# Patient Record
Sex: Female | Born: 1935 | Race: Black or African American | Hispanic: No | Smoking: Never smoker
Health system: Southern US, Community
[De-identification: ages and names within clinical notes are randomized; demographics above are authoritative.]

## PROBLEM LIST (undated history)

## (undated) DIAGNOSIS — E119 Type 2 diabetes mellitus without complications: Secondary | ICD-10-CM

---

## 2007-01-31 ENCOUNTER — Other Ambulatory Visit: Payer: Self-pay

## 2007-01-31 ENCOUNTER — Inpatient Hospital Stay: Payer: Self-pay | Admitting: Internal Medicine

## 2008-03-12 ENCOUNTER — Other Ambulatory Visit: Payer: Self-pay

## 2008-03-12 ENCOUNTER — Emergency Department: Payer: Self-pay | Admitting: Internal Medicine

## 2008-09-21 ENCOUNTER — Ambulatory Visit: Payer: Self-pay | Admitting: Family Medicine

## 2010-05-27 ENCOUNTER — Emergency Department: Payer: Self-pay | Admitting: Emergency Medicine

## 2010-12-03 ENCOUNTER — Observation Stay: Payer: Self-pay | Admitting: Internal Medicine

## 2011-01-04 ENCOUNTER — Emergency Department: Payer: Self-pay | Admitting: Emergency Medicine

## 2011-03-19 ENCOUNTER — Emergency Department: Payer: Self-pay | Admitting: Internal Medicine

## 2011-04-13 ENCOUNTER — Emergency Department: Payer: Self-pay | Admitting: Internal Medicine

## 2011-08-05 ENCOUNTER — Inpatient Hospital Stay: Payer: Self-pay | Admitting: Internal Medicine

## 2011-08-05 LAB — URINALYSIS, COMPLETE
Bilirubin,UR: NEGATIVE
Ketone: NEGATIVE
Leukocyte Esterase: NEGATIVE
Ph: 8 (ref 4.5–8.0)
Specific Gravity: 1.01 (ref 1.003–1.030)

## 2011-08-05 LAB — CK TOTAL AND CKMB (NOT AT ARMC)
CK, Total: 169 U/L (ref 21–215)
CK-MB: 1.2 ng/mL (ref 0.5–3.6)

## 2011-08-05 LAB — COMPREHENSIVE METABOLIC PANEL
Albumin: 3.3 g/dL — ABNORMAL LOW (ref 3.4–5.0)
Alkaline Phosphatase: 51 U/L (ref 50–136)
Bilirubin,Total: 0.3 mg/dL (ref 0.2–1.0)
Calcium, Total: 9.3 mg/dL (ref 8.5–10.1)
Chloride: 107 mmol/L (ref 98–107)
Creatinine: 1.3 mg/dL (ref 0.60–1.30)
EGFR (Non-African Amer.): 41 — ABNORMAL LOW
Glucose: 179 mg/dL — ABNORMAL HIGH (ref 65–99)
Osmolality: 299 (ref 275–301)
SGPT (ALT): 20 U/L
Sodium: 145 mmol/L (ref 136–145)
Total Protein: 7.7 g/dL (ref 6.4–8.2)

## 2011-08-05 LAB — CBC
HCT: 36.9 % (ref 35.0–47.0)
MCH: 27.6 pg (ref 26.0–34.0)
MCV: 86 fL (ref 80–100)
RBC: 4.31 10*6/uL (ref 3.80–5.20)
WBC: 5.6 10*3/uL (ref 3.6–11.0)

## 2011-08-05 LAB — TROPONIN I: Troponin-I: <0.02 ng/mL

## 2011-08-06 LAB — CBC WITH DIFFERENTIAL/PLATELET
Basophil #: 0 10*3/uL (ref 0.0–0.1)
Eosinophil #: 0.1 10*3/uL (ref 0.0–0.7)
HCT: 33.5 % — ABNORMAL LOW (ref 35.0–47.0)
Lymphocyte #: 1 10*3/uL (ref 1.0–3.6)
MCH: 27.5 pg (ref 26.0–34.0)
MCHC: 32.4 g/dL (ref 32.0–36.0)
MCV: 85 fL (ref 80–100)
Monocyte #: 0.5 10*3/uL (ref 0.0–0.7)
Monocyte %: 9.4 %
Neutrophil #: 3.6 10*3/uL (ref 1.4–6.5)
Neutrophil %: 69.3 %
Platelet: 189 10*3/uL (ref 150–440)
RBC: 3.95 10*6/uL (ref 3.80–5.20)
RDW: 16.5 % — ABNORMAL HIGH (ref 11.5–14.5)

## 2011-08-06 LAB — BASIC METABOLIC PANEL
Anion Gap: 11 (ref 7–16)
BUN: 20 mg/dL — ABNORMAL HIGH (ref 7–18)
Chloride: 106 mmol/L (ref 98–107)
EGFR (Non-African Amer.): 58 — ABNORMAL LOW
Glucose: 104 mg/dL — ABNORMAL HIGH (ref 65–99)
Osmolality: 295 (ref 275–301)

## 2011-09-11 ENCOUNTER — Ambulatory Visit: Payer: Self-pay | Admitting: Family Medicine

## 2011-09-13 ENCOUNTER — Ambulatory Visit: Payer: Self-pay | Admitting: Family Medicine

## 2012-06-11 ENCOUNTER — Ambulatory Visit: Payer: Self-pay | Admitting: Internal Medicine

## 2012-06-13 ENCOUNTER — Emergency Department: Payer: Self-pay | Admitting: Emergency Medicine

## 2012-06-13 LAB — URINALYSIS, COMPLETE
Bilirubin,UR: NEGATIVE
Leukocyte Esterase: NEGATIVE
Nitrite: NEGATIVE
Ph: 6 (ref 4.5–8.0)
Protein: NEGATIVE
RBC,UR: 2 /HPF (ref 0–5)
Specific Gravity: 1.019 (ref 1.003–1.030)
Squamous Epithelial: 6
WBC UR: 1 /HPF (ref 0–5)

## 2012-06-13 LAB — BASIC METABOLIC PANEL
Anion Gap: 6 — ABNORMAL LOW (ref 7–16)
BUN: 28 mg/dL — ABNORMAL HIGH (ref 7–18)
Calcium, Total: 9.4 mg/dL (ref 8.5–10.1)
Chloride: 106 mmol/L (ref 98–107)
Co2: 32 mmol/L (ref 21–32)
EGFR (African American): 54 — ABNORMAL LOW
EGFR (Non-African Amer.): 46 — ABNORMAL LOW
Osmolality: 295 (ref 275–301)

## 2012-06-13 LAB — CBC
HCT: 41.6 % (ref 35.0–47.0)
MCHC: 32 g/dL (ref 32.0–36.0)
MCV: 86 fL (ref 80–100)
Platelet: 200 10*3/uL (ref 150–440)
WBC: 5.1 10*3/uL (ref 3.6–11.0)

## 2012-08-02 ENCOUNTER — Emergency Department: Payer: Self-pay | Admitting: Emergency Medicine

## 2012-08-02 LAB — URINALYSIS, COMPLETE
Bacteria: NONE SEEN
Bilirubin,UR: NEGATIVE
Blood: NEGATIVE
Ketone: NEGATIVE
Leukocyte Esterase: NEGATIVE
Ph: 8 (ref 4.5–8.0)
Specific Gravity: 1.013 (ref 1.003–1.030)
Squamous Epithelial: <1
WBC UR: 1 /HPF (ref 0–5)

## 2012-08-02 LAB — COMPREHENSIVE METABOLIC PANEL
Albumin: 3.2 g/dL — ABNORMAL LOW (ref 3.4–5.0)
Anion Gap: 4 — ABNORMAL LOW (ref 7–16)
BUN: 24 mg/dL — ABNORMAL HIGH (ref 7–18)
Bilirubin,Total: 0.4 mg/dL (ref 0.2–1.0)
Chloride: 105 mmol/L (ref 98–107)
Creatinine: 0.92 mg/dL (ref 0.60–1.30)
EGFR (African American): >60
Glucose: 124 mg/dL — ABNORMAL HIGH (ref 65–99)
Potassium: 3.4 mmol/L — ABNORMAL LOW (ref 3.5–5.1)
SGPT (ALT): 15 U/L (ref 12–78)
Sodium: 140 mmol/L (ref 136–145)
Total Protein: 7.8 g/dL (ref 6.4–8.2)

## 2012-08-02 LAB — CBC
HCT: 40.3 % (ref 35.0–47.0)
HGB: 13.2 g/dL (ref 12.0–16.0)
MCHC: 32.6 g/dL (ref 32.0–36.0)
Platelet: 198 10*3/uL (ref 150–440)
RDW: 15.5 % — ABNORMAL HIGH (ref 11.5–14.5)

## 2012-12-24 ENCOUNTER — Emergency Department: Payer: Self-pay

## 2012-12-24 LAB — CBC WITH DIFFERENTIAL/PLATELET
Eosinophil #: 0.1 10*3/uL (ref 0.0–0.7)
HCT: 37.2 % (ref 35.0–47.0)
Lymphocyte #: 1.3 10*3/uL (ref 1.0–3.6)
Lymphocyte %: 25.6 %
MCHC: 31.2 g/dL — ABNORMAL LOW (ref 32.0–36.0)
MCV: 85 fL (ref 80–100)
Monocyte #: 0.5 x10 3/mm (ref 0.2–0.9)
Neutrophil #: 3.3 10*3/uL (ref 1.4–6.5)
Neutrophil %: 62.9 %
RBC: 4.36 10*6/uL (ref 3.80–5.20)
WBC: 5.2 10*3/uL (ref 3.6–11.0)

## 2012-12-24 LAB — URINALYSIS, COMPLETE
Blood: NEGATIVE
Ketone: NEGATIVE
Leukocyte Esterase: NEGATIVE
Ph: 7 (ref 4.5–8.0)
Protein: NEGATIVE
RBC,UR: <1 /HPF (ref 0–5)
Specific Gravity: 1.016 (ref 1.003–1.030)
Squamous Epithelial: 2
WBC UR: 1 /HPF (ref 0–5)

## 2012-12-24 LAB — BASIC METABOLIC PANEL
Anion Gap: 6 — ABNORMAL LOW (ref 7–16)
Calcium, Total: 9.6 mg/dL (ref 8.5–10.1)
Chloride: 106 mmol/L (ref 98–107)
EGFR (African American): 53 — ABNORMAL LOW
EGFR (Non-African Amer.): 45 — ABNORMAL LOW
Glucose: 130 mg/dL — ABNORMAL HIGH (ref 65–99)

## 2013-03-18 ENCOUNTER — Emergency Department: Payer: Self-pay | Admitting: Emergency Medicine

## 2013-05-21 ENCOUNTER — Inpatient Hospital Stay: Payer: Self-pay | Admitting: Internal Medicine

## 2013-05-21 LAB — URINALYSIS, COMPLETE
Bilirubin,UR: NEGATIVE
Blood: NEGATIVE
Glucose,UR: 50 mg/dL (ref 0–75)
Ketone: NEGATIVE
Leukocyte Esterase: NEGATIVE
Nitrite: NEGATIVE
Protein: NEGATIVE
RBC,UR: 4 /HPF (ref 0–5)
Squamous Epithelial: 15

## 2013-05-21 LAB — TROPONIN I
Troponin-I: <0.02 ng/mL
Troponin-I: <0.02 ng/mL
Troponin-I: 0.02 ng/mL

## 2013-05-21 LAB — BASIC METABOLIC PANEL
Anion Gap: 8 (ref 7–16)
BUN: 21 mg/dL — ABNORMAL HIGH (ref 7–18)
Calcium, Total: 9.7 mg/dL (ref 8.5–10.1)
Chloride: 103 mmol/L (ref 98–107)
Co2: 29 mmol/L (ref 21–32)
EGFR (African American): >60
Glucose: 172 mg/dL — ABNORMAL HIGH (ref 65–99)
Osmolality: 286 (ref 275–301)
Potassium: 3.2 mmol/L — ABNORMAL LOW (ref 3.5–5.1)
Sodium: 140 mmol/L (ref 136–145)

## 2013-05-21 LAB — CBC WITH DIFFERENTIAL/PLATELET
Basophil #: 0 10*3/uL (ref 0.0–0.1)
Eosinophil #: 0.1 10*3/uL (ref 0.0–0.7)
Eosinophil %: 1.6 %
HCT: 40.3 % (ref 35.0–47.0)
Lymphocyte #: 0.9 10*3/uL — ABNORMAL LOW (ref 1.0–3.6)
MCH: 26.7 pg (ref 26.0–34.0)
MCHC: 31.5 g/dL — ABNORMAL LOW (ref 32.0–36.0)
MCV: 85 fL (ref 80–100)
Monocyte #: 0.4 x10 3/mm (ref 0.2–0.9)
Neutrophil %: 67.5 %
Platelet: 187 10*3/uL (ref 150–440)
RBC: 4.77 10*6/uL (ref 3.80–5.20)
RDW: 16 % — ABNORMAL HIGH (ref 11.5–14.5)
WBC: 4.4 10*3/uL (ref 3.6–11.0)

## 2013-05-21 LAB — CK TOTAL AND CKMB (NOT AT ARMC): CK-MB: 0.8 ng/mL (ref 0.5–3.6)

## 2013-05-22 LAB — CBC WITH DIFFERENTIAL/PLATELET
Basophil #: 0.1 10*3/uL (ref 0.0–0.1)
Basophil %: 2 %
Eosinophil %: 2.5 %
HGB: 12.9 g/dL (ref 12.0–16.0)
MCH: 27.3 pg (ref 26.0–34.0)
MCHC: 32.2 g/dL (ref 32.0–36.0)
Monocyte #: 0.4 x10 3/mm (ref 0.2–0.9)
Neutrophil #: 3 10*3/uL (ref 1.4–6.5)
Platelet: 192 10*3/uL (ref 150–440)

## 2013-05-22 LAB — BASIC METABOLIC PANEL
Anion Gap: 4 — ABNORMAL LOW (ref 7–16)
BUN: 15 mg/dL (ref 7–18)
Calcium, Total: 9.4 mg/dL (ref 8.5–10.1)
Chloride: 103 mmol/L (ref 98–107)
Creatinine: 0.88 mg/dL (ref 0.60–1.30)
EGFR (African American): >60
EGFR (Non-African Amer.): 58 — ABNORMAL LOW
Glucose: 124 mg/dL — ABNORMAL HIGH (ref 65–99)
Osmolality: 280 (ref 275–301)
Potassium: 3.1 mmol/L — ABNORMAL LOW (ref 3.5–5.1)
Sodium: 139 mmol/L (ref 136–145)

## 2013-05-22 LAB — MAGNESIUM: Magnesium: 1.8 mg/dL

## 2013-05-22 LAB — TSH: Thyroid Stimulating Horm: 4.52 u[IU]/mL — ABNORMAL HIGH

## 2013-05-22 LAB — LIPID PANEL
Cholesterol: 199 mg/dL (ref 0–200)
Ldl Cholesterol, Calc: 119 mg/dL — ABNORMAL HIGH (ref 0–100)
VLDL Cholesterol, Calc: 22 mg/dL (ref 5–40)

## 2013-05-23 LAB — MAGNESIUM: Magnesium: 1.6 mg/dL — ABNORMAL LOW

## 2013-05-23 LAB — POTASSIUM: Potassium: 3.3 mmol/L — ABNORMAL LOW (ref 3.5–5.1)

## 2013-06-03 ENCOUNTER — Other Ambulatory Visit: Payer: Self-pay | Admitting: Family Medicine

## 2013-06-04 ENCOUNTER — Other Ambulatory Visit: Payer: Self-pay | Admitting: Family Medicine

## 2013-06-04 LAB — COMPREHENSIVE METABOLIC PANEL
Albumin: 3.1 g/dL — ABNORMAL LOW (ref 3.4–5.0)
BUN: 33 mg/dL — ABNORMAL HIGH (ref 7–18)
Calcium, Total: 9.1 mg/dL (ref 8.5–10.1)
Chloride: 104 mmol/L (ref 98–107)
Creatinine: 1.03 mg/dL (ref 0.60–1.30)
EGFR (Non-African Amer.): 48 — ABNORMAL LOW
Glucose: 138 mg/dL — ABNORMAL HIGH (ref 65–99)
Osmolality: 283 (ref 275–301)
SGOT(AST): 39 U/L — ABNORMAL HIGH (ref 15–37)

## 2013-06-04 LAB — CBC WITH DIFFERENTIAL/PLATELET
Basophil #: 0 10*3/uL (ref 0.0–0.1)
Eosinophil #: 0 10*3/uL (ref 0.0–0.7)
HCT: 40 % (ref 35.0–47.0)
HGB: 12.8 g/dL (ref 12.0–16.0)
Lymphocyte #: 0.6 10*3/uL — ABNORMAL LOW (ref 1.0–3.6)
Lymphocyte %: 13.7 %
MCH: 27.1 pg (ref 26.0–34.0)
MCHC: 32 g/dL (ref 32.0–36.0)
MCV: 85 fL (ref 80–100)
Monocyte #: 0.5 x10 3/mm (ref 0.2–0.9)
Monocyte %: 11.2 %
Neutrophil #: 2.9 10*3/uL (ref 1.4–6.5)
Platelet: 181 10*3/uL (ref 150–440)
RBC: 4.72 10*6/uL (ref 3.80–5.20)
RDW: 15.8 % — ABNORMAL HIGH (ref 11.5–14.5)
WBC: 4 10*3/uL (ref 3.6–11.0)

## 2014-08-23 ENCOUNTER — Emergency Department: Payer: Self-pay | Admitting: Emergency Medicine

## 2014-09-30 NOTE — Discharge Summary (Signed)
PATIENT NAME:  Carrie SailorsRICHMOND, Carrie M MR#:  [id number] DATE OF BIRTH:  September 12, 1921  DATE OF ADMISSION:  05/21/2013 DATE OF DISCHARGE: 05/24/2013  PRESENTING COMPLAINT: Falls and weakness.   DISCHARGE DIAGNOSES: 1. Falls likely due to generalized deconditioning. The patient will be going to rehab for physical therapy.  2. Chronic paroxysmal atrial fibrillation. Hemodynamically stable, asymptomatic. Not a good candidate for anticoagulation due to falls. Continue aspirin.  3. Type 2 diabetes.  4. Hyperlipidemia.  5. Hypokalemia on oral supplements.  6. Hypertension.  7. Hypothyroidism, new onset. The patient started on oral Synthroid.   CONDITION ON DISCHARGE: Fair.   CODE STATUS: Full code.   MEDICATIONS: Magnesium oxide.   [name] is a pleasant 81 year old African American female who is brought in from home after she had a fall on 12/12. She has history of hypertension, diabetes and hyperlipidemia. She was admitted with:  1. Mechanical fall which is suspected due to her generalized deconditioning, less likely due to atrial fibrillation. The patient was seen by physical therapy. They recommend rehab for which  social worker is working on getting a place for her.  2. Paroxysmal atrial fibrillation hemodynamically stable. The patient asymptomatic. Rate controlled with low-dose metoprolol. The patient is tolerating it well. Given her frequent falls she is not a good candidate for anticoagulation, her aspirin was continued.  3. Type 2 diabetes. Metformin and pioglitazone is continued.  4. Hyperlipidemia. Continue simvastatin.  5. Hyperkalemia on oral supplements along with hypomagnesemia on p.o. mag. 6. Hypothyroidism, new onset start low dose p.o. Synthroid.  7. Hypertension. The patient currently is on felodipine, lisinopril, metoprolol and low-dose hydrochlorothiazide.   Overall hospital stay remained stable.   CODE STATUS: THE PATIENT REMAINED A FULL CODE.   The patient will be  discharged to rehab once bed available.   DISCHARGE VITAL SIGNS: Blood pressure is 129/76. Pulse rate was 65, sats are 97% on room air.   ____________________________ Jearl KlinefelterSona A. Allena KatzPatel, MD sap:sg D: 05/24/2013 09:26:00 ET T: 05/24/2013 10:00:54 ET JOB#: [id number]  cc: Neomia Dearavid N. Harrington Challengerhies, MD Kaleia Longhi A. Allena KatzPatel, MD, <Dictator>   Willow OraSONA A Raechel Marcos MD ELECTRONICALLY SIGNED 05/27/2013 10:57

## 2014-10-01 NOTE — H&P (Signed)
PATIENT NAME:  [name], Shanta M MR#:  [id number] DATE OF BIRTH:  [birth year]  DATE OF ADMISSION:  05/21/2013  PRIMARY CARE PHYSICIAN: Dr. Mickey Farberavid Thies.    REFERRING PHYSICIAN: Dr. Bayard Malesandolph Brown.  CHIEF COMPLAINT: Fell down.   HISTORY OF PRESENT ILLNESS: [name] is an 81 year old female with a past medical history of diabetes mellitus on oral medication, hypertension, hyperlipidemia, presented to the Emergency Department after having a fall at home. Per daughter, the patient has frequent falls. Last night, the patient was trying to stand up from a sitting position, felt dizzy and fell down to the floor. The patient felt too weak to stand up. Concerning this, EMS was called and was brought to the Emergency Department. At the time of presentation, the patient had atrial fibrillation with a heart rate of 105; however, at the time of my examination, the patient is already in a normal sinus rhythm. The patient states experiences severe by mouth. Denies having any chest pain, palpitations. No recent history of any diarrhea. Workup in the Emergency Department, low potassium of 3.2. The patient is on hydrochlorothiazide and other peripheral vasodilating anti-blood pressure medications like felodipine. Per daughter, the patient lives a sedentary lifestyle. Sits most of the day on the couch. Involved with physical therapy multiple times; however, the patient was not interested in continuing with the therapy.   PAST MEDICAL HISTORY:  1. Hypertension.  2. Hyperlipidemia.  3. Diabetes mellitus.  4. Osteoarthritis.  5. Iron deficiency anemia.   PAST SURGICAL HISTORY:  1. Right and left knee surgery.  2. Bilateral hip replacement.   ALLERGIES: No known drug allergies.   HOME MEDICATIONS:  1. Vitamin D2 50,000 units .   2. Tramadol 50 mg every 6 hours as needed.  3. Simvastatin 20 mg once a day.  4. Nitrostat 0.4 sublingual every 5 minutes as needed.  5. Metformin/pioglitazone 1 tablet once a day.   6. Hydrochlorothiazide/lisinopril 1 tablet once a day.  7. Felodipine 10 mg once a day.  8. Aspirin 81 mg daily.   SOCIAL HISTORY: No history of smoking, drinking alcohol or using illicit drugs. Lives with her son.   FAMILY HISTORY: Noncontributory at the age of 81; however, per previous records, the patient does not recall her family history.   REVIEW OF SYSTEMS:  CONSTITUTIONAL: Experiences generalized weakness.  EYES: No change in vision.  ENT: No change in hearing. The patient is somewhat hard of hearing.  RESPIRATORY: No cough, shortness of breath.  CARDIOVASCULAR: No chest pain, palpitations.  GASTROINTESTINAL: No nausea, vomiting or diarrhea.  GENITOURINARY: No dysuria or hematuria.  ENDOCRINOLOGY: No polyuria or polydipsia.  HEMATOLOGIC: No easy bruising or bleeding.  SKIN: No rash or lesions.  MUSCULOSKELETAL: Has osteoarthritis.  NEUROLOGIC: No weakness  or numbness in any part of the body.   PSYCHIATRIC: No depression.   PHYSICAL EXAMINATION:  GENERAL: This is a well-built, well-nourished, age-appropriate female lying down in the bed, not in distress.  VITAL SIGNS: Temperature 98.2, pulse 76, blood pressure 140/74, respiratory rate of 20, oxygen saturation is 98% on room air.  HEENT: Head normocephalic, atraumatic. There is no scleral icterus. Conjunctivae normal. Pupils equal and react to light. Extraocular movements are intact. Mucous membranes are dry. No pharyngeal erythema.  NECK: Supple. No lymphadenopathy. No JVD. No carotid bruit. No thyromegaly.  CHEST: Has no focal tenderness.  LUNGS: Bilaterally clear to auscultation.  HEART: S1, S2 regular. No murmurs are heard. No pedal edema. Pulses 2+.  ABDOMEN: Obese. Bowel sounds present.  Soft, nontender, nondistended. No hepatosplenomegaly.  SKIN: No rash or lesions.  MUSCULOSKELETAL: Good range of motion in all of the extremities.  NEUROLOGIC: The patient is alert, oriented to place, person. Could not tell the year.  Cranial nerves II through XII intact. Motor 5/5 in upper and lower extremities. No sensory deficits.   LABORATORIES: CBC is completely within normal limits. CMP, except for potassium of 3.2, mild elevation of the BUN of 21, creatinine of 0.86, the rest of all of the values are within normal limits. No signs of any infection. UA is clear. Troponin less than 0.02.   ASSESSMENT AND PLAN: Ms. Shiffman is an 81 year old female brought to the Emergency Department after having a fall at home.  1. Fall: Seems to be more of a mechanical fall; however, concern about orthostatic hypotension. Will check the orthostatics. Concerning the patient's dry mouth, keep the patient on intravenous fluids. The patient also has debility at baseline. The patient did not have any loss of consciousness. Concerning the patient's atrial fibrillation new onset, will admit the patient to a monitored bed.  2. Atrial fibrillation, new onset: The patient already converted to normal sinus rhythm. Obesity, hypokalemia could have contributed to this. Continue with intravenous fluids. Replace the potassium; however, concerning new onset paroxysmal atrial fibrillation, will obtain echocardiogram. Concerning the patient's frequent falls, the patient is not a candidate for any anticoagulation.  3. Hypertension: Will hold the hydrochlorothiazide concerning the patient's frequent falls. Continue with lisinopril and felodipine. Will add Coreg 6.25 mg b.i.d. to control the heart rate.  4. Diabetes mellitus: Continue with metformin and sliding scale insulin.  5. Debility: Will involve physical therapy, occupational therapy.  6. Keep the patient on deep vein thrombosis prophylaxis with Lovenox.   TIME SPENT: 45 minutes.   ____________________________ Susa Griffins, MD pv:gb D: 05/21/2013 02:08:28 ET T: 05/21/2013 03:29:25 ET JOB#: [id number]  cc: Susa Griffins, MD, <Dictator> Neomia Dear. Harrington Challenger, MD Clerance Lav Radley Teston MD ELECTRONICALLY  SIGNED 06/18/2013 21:01

## 2014-10-02 NOTE — Discharge Summary (Signed)
PATIENT NAME:  Carrie SailorsRICHMOND, Carrie Juarez MR#:  [id number] DATE OF BIRTH:  08/17/1921  DATE OF ADMISSION:  08/05/2011 DATE OF DISCHARGE:  08/07/2011  DISCHARGE DIAGNOSES:  1. Malignant hypertension.  2. Diabetes.  3. Lower extremity swelling.  4. Generalized weakness.  5. Anemia.  6. Hypokalemia.   DISPOSITION: The patient is being discharged to a rehab facility.   DIET: Low sodium, 1800-calorie ADA mechanical soft diet.   ACTIVITY: As tolerated. Please continue PT.   FOLLOWUP: Follow up with Dr. Mickey Farberavid Thies 1 to 2 weeks after discharge.   DISCHARGE MEDICATIONS:  1. Tylenol 650 mg every six hours p.r.n. pain.  2. Zofran ODT 4 mg p.o. every six hours for nausea, vomiting.  3. NovoLog insulin sliding scale.  4. Lisinopril/HCTZ 10/12.5 mg daily.  5. Vitamin D2 50,000 international units once a week. 6. Ferrous sulfate 325 mg daily.  7. Aspirin 81 mg daily.  8. Felodipine 5 mg daily.  9. Metformin/pioglitazone 500/15 1 tablet daily.   LABORATORY, DIAGNOSTIC, AND RADIOLOGICAL DATA: Urine culture showed mixed bacterial organisms suggestive of contamination. 2-D echo: Normal left ventricular function, left ventricular hypertrophy, mild MR and TR. Initial chest x-ray showed possible minimal infiltrate right upper lobe. Repeat chest x-ray PA and lateral showed no acute changes. Carotid ultrasound showed no hemodynamically significant stenosis. Bilateral lower extremity Doppler showed no evidence of any deep vein thrombosis. Normal white count. Normal platelet count. Hemoglobin 11.9 to 10.9. Glucose 179 to 104. Normal renal function. Hemoglobin A1c 7.5. Normal cardiac enzymes. Normal liver function.   HOSPITAL COURSE: The patient is an 81 year old female with past medical history of diabetes and hypertension who was brought in by family for weakness. She was found to have malignant hypertension on admission. She was continued on her felodipine. Lasix and HCTZ have been added to her treatment regimen  with good control of her blood pressure. She also had lower extremity swelling, which was probably chronic. Doppler showed no evidence of deep vein thrombosis. She had clinically no evidence of congestive heart failure. Her echocardiogram showed normal ejection fraction with left ventricular hypertrophy and mild MR and TR. She has generalized weakness. Carotid ultrasound showed no hemodynamically significant stenosis. The patient was evaluated by physical therapy who recommended rehab to which the patient and her family were agreeable. Urinalysis initially showed bacteruria. Her urine culture showed mixed bacterial organisms. The patient denied any dysuria. There was questionable infiltrate on her AP chest x-ray film; therefore, she was empirically started on antibiotics. A repeat PA and lateral chest x-ray showed no evidence of infiltrate and her antibiotics were subsequently discontinued. The patient has a normal white count and is afebrile. She had mild hypokalemia which has been replaced. She had very mild hypernatremia and has been encouraged to have good oral intake of fluids. She has anemia, likely of chronic disease. Her diabetes was overall controlled on metformin and pioglitazone.  Her hemoglobin A1c is 7.5. The patient is being discharged in stable condition.   TIME SPENT: 45 minutes.   ____________________________ Darrick MeigsSangeeta Aleksia Freiman, MD sp:bjt D: 08/07/2011 13:20:17 ET T: 08/07/2011 13:43:02 ET JOB#: [id number]  cc: Darrick MeigsSangeeta Corra Kaine, MD, <Dictator> Neomia Dearavid N. Harrington Challengerhies, MD Darrick MeigsSANGEETA Deshana Rominger MD ELECTRONICALLY SIGNED 08/07/2011 14:13

## 2014-10-02 NOTE — H&P (Signed)
PATIENT NAME:  Carrie Juarez, Carrie Juarez MR#:  [id number] DATE OF BIRTH:  08/17/1921  DATE OF ADMISSION:  08/05/2011  PRIMARY CARE PHYSICIAN: Dr. Harrington Challengerhies    HISTORY OF PRESENT ILLNESS: The patient is an 81 year old African American female with past medical history significant for history of hypertension, diabetes, and hyperlipidemia who presented to the hospital with complaints of generalized weakness. Apparently the patient was doing well up until approximately one week ago when she started having problems with increasing weakness, generalized weakness. She denied any other symptoms, however, she is demented and not able to provide much history. She denied any fevers or chills. Denied any other problems such as orthostatic hypotension or pains. Denied any chest pains, however, admitted of some palpitations intermittently. Admitted also of increased frequency of urination, however, denied any dysuria symptoms. In the Emergency Room, she was noted to be hypertensive and she was also noted to have bacteruria on urinalysis and hospitalist services were contacted for admission.   PAST MEDICAL HISTORY:  1. History of admission for generalized weakness in June 2012. At that time she was thought to have generalized weakness due to bradycardia due to beta-blocker as well as possible dehydration so the patient's metoprolol as well as triamterene/hydrochlorothiazide were stopped.  2. History of hypertension. 3. Hyperlipidemia. 4. Diabetes mellitus.   MEDICATIONS:  1. Actoplus 15/500 one tablet at bedtime. 2. Felodipine ER 5 mg p.o. daily.  3. Aspirin 81 mg p.o. daily. 4. Meclizine 25 mg every eight hours as needed.  5. Iron sulfate 325 mg p.o. daily. 6. Vitamin D 50,000 units weekly.   ALLERGIES: No known drug allergies.   PAST SURGICAL HISTORY: None.   SOCIAL HISTORY: The patient has no tobacco, alcohol, or drug abuse.   FAMILY HISTORY: The patient does not recall her family history.  REVIEW OF SYSTEMS:  Positive for fatigue and weakness, also heart palpitations as well as increased frequency of urination as well as swelling in lower extremities for the past one week. Denies any fevers, chills, pains, weight loss or gain. In regards to eyes, denies any blurry vision, double vision, glaucoma, or cataracts. ENT: Denies any tinnitus, allergies, epistaxis, sinus pain, dentures, difficulty swallowing. RESPIRATORY: Denies any cough, wheezes, asthma, or chronic obstructive pulmonary disease. CARDIOVASCULAR: Denies chest pains, orthopnea, or syncope. GASTROINTESTINAL: Denies any nausea, vomiting, diarrhea, or constipation. GENITOURINARY: Denies any dysuria, hematuria, or incontinence. ENDOCRINOLOGY: Denies any polydipsia, nocturia, thyroid problems, heat or cold intolerance, or thirst. HEMATOLOGIC: Denies anemia, easy bruising, bleeding, or swollen glands. SKIN: Denies any acne, rashes, lesions, or change in moles. MUSCULOSKELETAL: Denies arthritis, cramps, swelling, gout. NEUROLOGIC: No numbness, epilepsy, or tremor. PSYCH: Denies anxiety, insomnia, or depression. Admits of having pains intermittently in her knees as well as back which she attributes to arthritic pains, otherwise, no significant problems.   PHYSICAL EXAMINATION:  VITAL SIGNS: On arrival to the Emergency Room, temperature 97.1, pulse 86, respiration rate 18, blood pressure 180/86, saturation 96% on room air. The patient's orthostatic vital signs were performed while she was in the Emergency Room and she was noted to have blood pressure 154/77 with pulse of 70 when she was lying down; 171/83 with pulse of 69 when she was sitting; 161/83 with pulse of 62 when she was standing.   GENERAL: This is a well developed, well nourished [place] in no significant distress laying on the stretcher.   HEENT: Her pupils are equal and reactive to light. Extraocular movements intact. No icterus or conjunctivitis. Has some difficulty hearing.  No  pharyngeal erythema. Mucosa is moist.   NECK: No masses, supple, nontender. Thyroid not enlarged. No adenopathy. No JVD or carotid bruits bilaterally. Full range of motion.   LUNGS: Clear to auscultation. No significant rales, rhonchi, diminished breath sounds, or wheezing. No labored inspirations. No dullness to percussion or overt respiratory distress.   CARDIOVASCULAR: S1, S2 appreciated. Rhythm was regular. The patient does have murmur in the precordial area, 3 out of 6 systolic murmur was heard radiating to left axilla. PMI not lateralized. Chest is nontender to palpation. 1+ pedal pulses. 1+ to 2+ lower extremity edema was noted but no calf tenderness or cyanosis was noted. Mostly swelling was around the patient's ankles as well as in lower calf.   ABDOMEN: Soft, nontender. Bowel sounds are present. No hepatosplenomegaly or masses were noted.   RECTAL: Deferred.   MUSCULOSKELETAL: Able to move all extremities 4/4. No cyanosis, degenerative joint disease, or kyphosis. Gait is not tested.   SKIN: Skin did not reveal any rashes, lesions, erythema, nodularity, or induration. It was warm and dry to palpation.   LYMPH: No adenopathy in the cervical region.   NEUROLOGICAL: Cranial nerves grossly intact. Sensory is intact. The patient does have some mild dysarthria but no aphasia. The patient is alert, oriented to self, cooperative. Memory is impaired.   PSYCHIATRIC: No significant confusion, agitation, or depression noted.   LABORATORY, DIAGNOSTIC, AND RADIOLOGICAL DATA: EKG showed normal sinus rhythm at 76 beats per minute, first degree AV block, premature atrial complexes. No acute ST-T changes were noted.   Lab data revealed a BMP with glucose level 179, BUN 28, otherwise unremarkable study. The patient's estimated GFR for African American would be 50. The patient's liver enzymes showed albumin level of 3.3, otherwise unremarkable study. Cardiac enzymes, first set, negative. CBC white blood  cell count is normal at 5.6, hemoglobin 11.9, platelets 181. Urinalysis revealed colorless hazy urine, negative for glucose, bilirubin, or ketones, specific gravity 1.010, pH 8.0, negative for blood, protein, nitrites, or leukocyte esterase, 0 to 5 red blood cells, rare white blood cells, 2+ bacteria were noted as well as 5 to 15 epithelial cells and amorphous phosphates were noted. Radiologic studies are still pending.   ASSESSMENT AND PLAN:  1. Malignant hypertension. Admit patient to medical floor. Restart her on felodipine ER at 5 mg p.o. daily dose. Add ACE inhibitor at 5 mg p.o. daily dose such as lisinopril. Will advance the patient's blood lisinopril depending on the patient's needs.  2. Cardiac murmur. The patient's last echocardiogram was done in June 2012. At that time, ejection fraction was noted to be 50%. The patient was noted to have mild mitral regurgitation as well as tricuspid regurgitation and LVH. Will get echocardiogram repeated this time as well.  3. Lower extremity swelling, likely CHF, right heart, possibly due to tricuspid regurgitation. Will get echocardiogram. Will get Doppler ultrasound. Will start her on Lasix as well as ACE inhibitor. 4. Generalized weakness. Will get physical therapy involved as well as Child psychotherapist for placement likely to rehab versus skilled nursing facility as the patient's family I'Juarez concerned are not supervising the patient well at home.  5. Questionable urinary tract infection. Get cultures. Will start Rocephin.   TIME SPENT: One hour.   ____________________________ Katharina Caper, MD rv:drc D: 08/05/2011 16:25:36 ET T: 08/05/2011 17:26:48 ET JOB#: [id number]  cc: Katharina Caper, MD, <Dictator> Neomia Dear. Harrington Challenger, MD Katharina Caper MD ELECTRONICALLY SIGNED 08/07/2011 14:31

## 2014-11-24 IMAGING — CT CT HEAD WITHOUT CONTRAST
1 series · 15 of 30 positions shown, 19 images · non-contrast
Comparison: CT of the head performed 03/19/2011

CLINICAL DATA: Weakness.  Near syncope.

EXAM:
CT HEAD WITHOUT CONTRAST
TECHNIQUE: Contiguous axial images were obtained from the base of the skull
through the vertex without intravenous contrast.

[Series 2: head wo · axial · 0.41mm/px · z∈[-154,-32]mm · 15 of 30 slices shown, 19 images]
[im 2/30  brain]
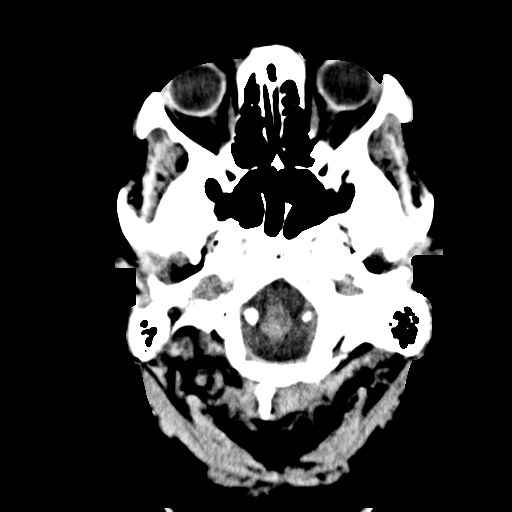
[im 2/30  bone]
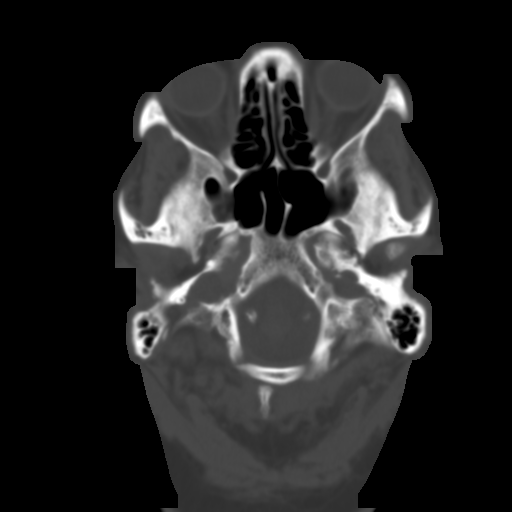
[im 4/30  brain]
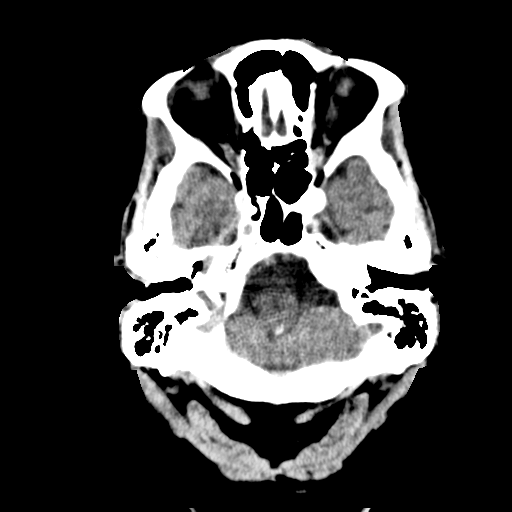
[im 6/30  brain]
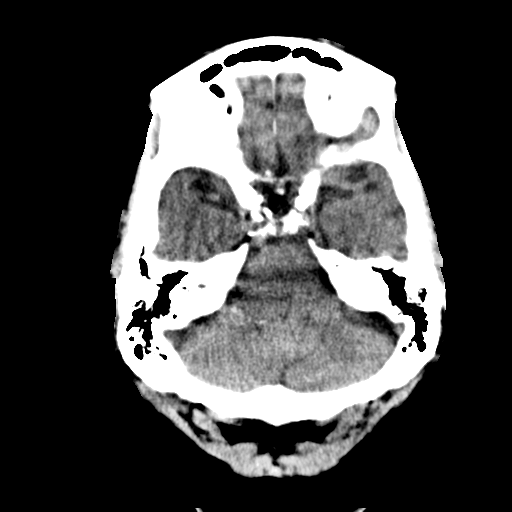
[im 8/30  brain]
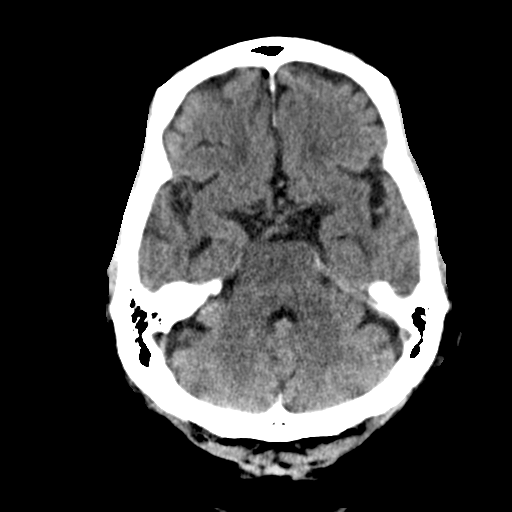
[im 10/30  brain]
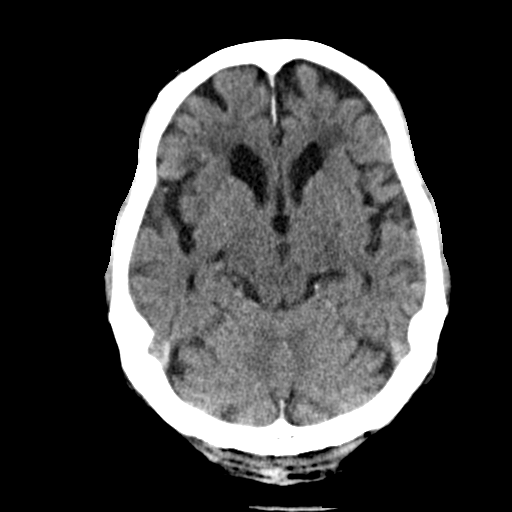
[im 10/30  bone]
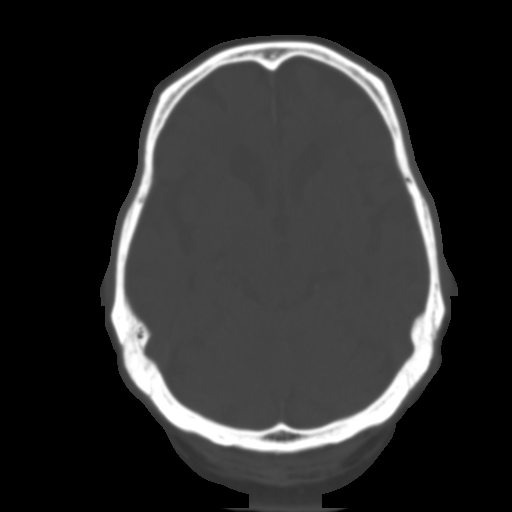
[im 12/30  brain]
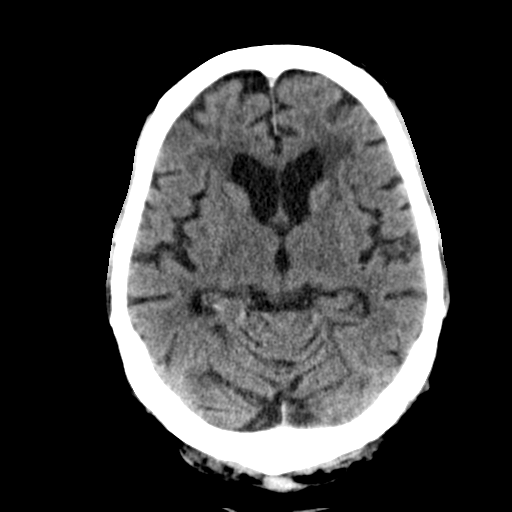
[im 14/30  brain]
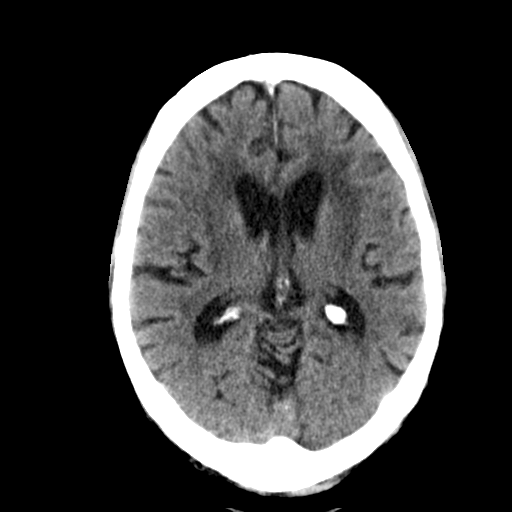
[im 16/30  brain]
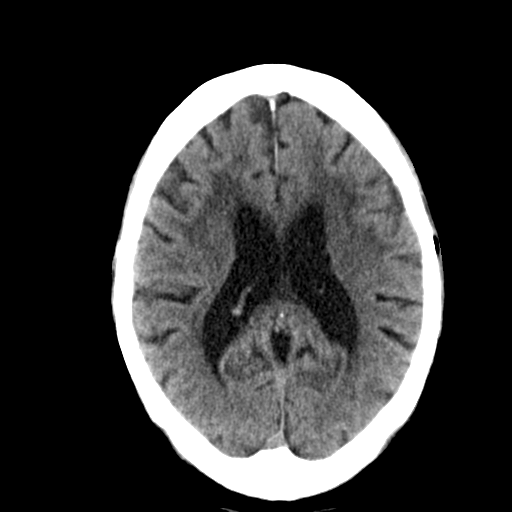
[im 17/30  brain]
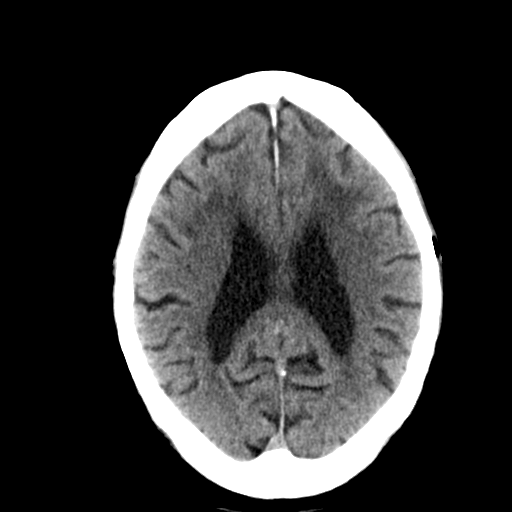
[im 17/30  bone]
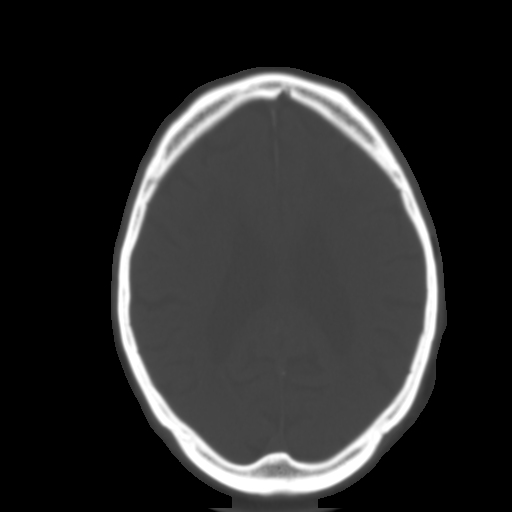
[im 19/30  brain]
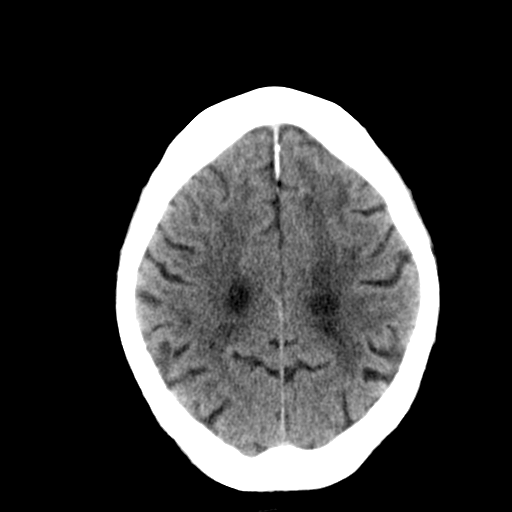
[im 21/30  brain]
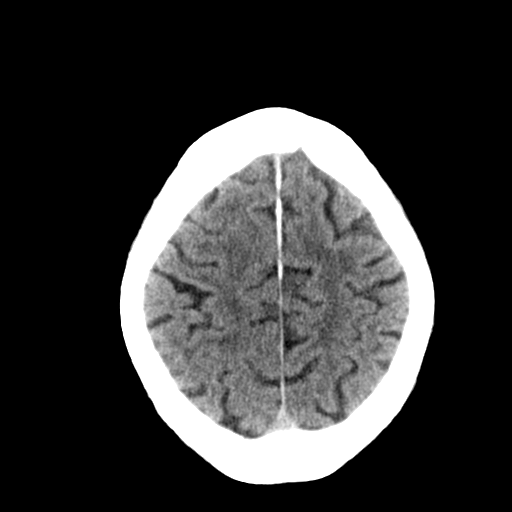
[im 23/30  brain]
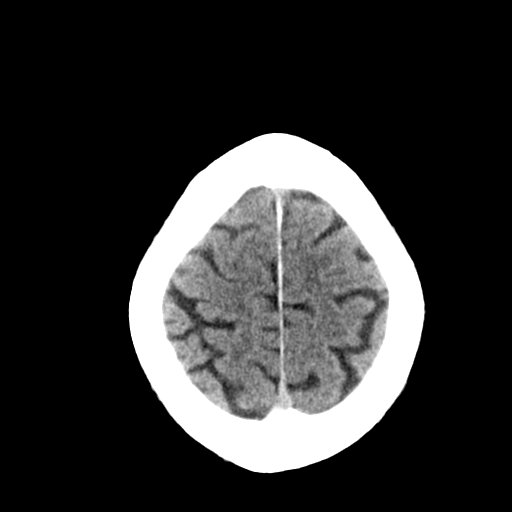
[im 25/30  brain]
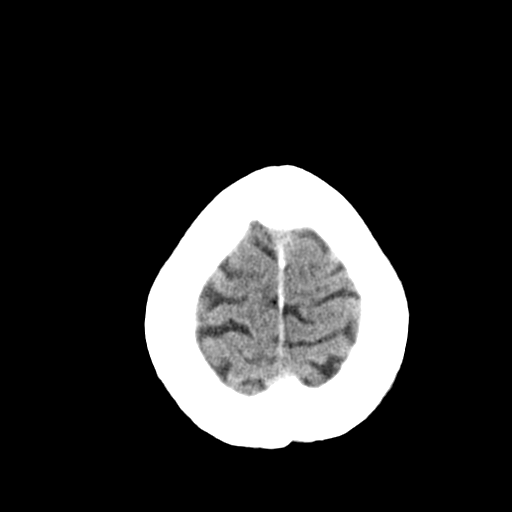
[im 25/30  bone]
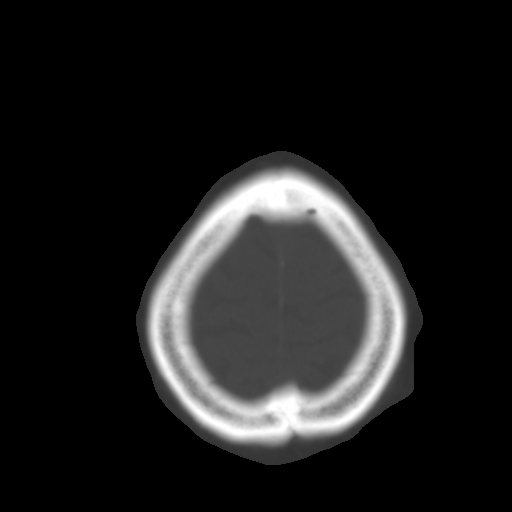
[im 27/30  brain]
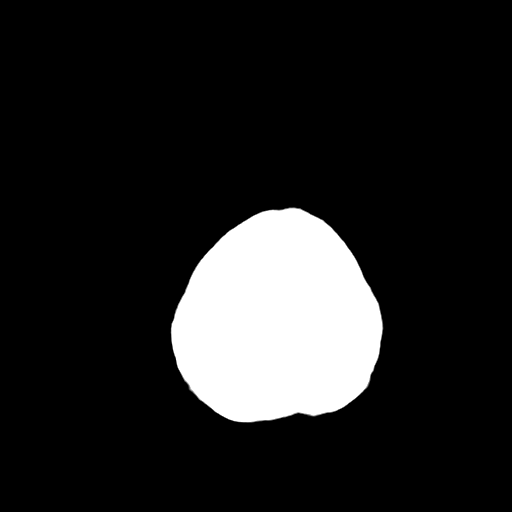
[im 29/30  brain]
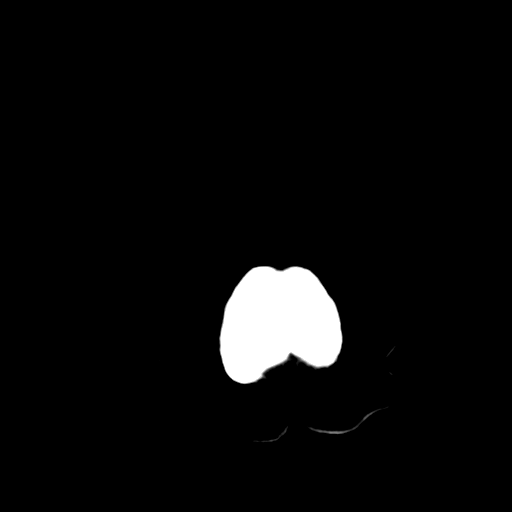

[15 of 30 positions shown; findings below may reference images not displayed]

FINDINGS: There is no evidence of acute infarction, mass lesion, or intra- or
extra-axial hemorrhage on CT.

Prominence of the ventricles and sulci reflects mild cortical volume
loss. Scattered periventricular and subcortical white matter change
likely reflects small vessel ischemic microangiopathy.

The brainstem and fourth ventricle are within normal limits. The
basal ganglia are unremarkable in appearance. The cerebral
hemispheres demonstrate grossly normal gray-white differentiation.
No mass effect or midline shift is seen.

There is no evidence of fracture; visualized osseous structures are
unremarkable in appearance. The visualized portions of the orbits
are within normal limits. The paranasal sinuses and mastoid air
cells are well-aerated. No significant soft tissue abnormalities are
seen.
IMPRESSION: 1. No acute intracranial pathology seen on CT.
2. Mild cortical volume loss and scattered small vessel ischemic
microangiopathy.

## 2015-02-05 ENCOUNTER — Emergency Department: Payer: Medicare Other

## 2015-02-05 ENCOUNTER — Emergency Department
Admission: EM | Admit: 2015-02-05 | Discharge: 2015-02-05 | Disposition: A | Discharge status: Home / Self Care | Payer: Medicare Other | Attending: Emergency Medicine | Admitting: Emergency Medicine

## 2015-02-05 ENCOUNTER — Encounter: Payer: Self-pay | Admitting: Emergency Medicine

## 2015-02-05 ENCOUNTER — Other Ambulatory Visit: Payer: Self-pay

## 2015-02-05 DIAGNOSIS — R5383 Other fatigue: Secondary | ICD-10-CM | POA: Diagnosis present

## 2015-02-05 DIAGNOSIS — R4182 Altered mental status, unspecified: Secondary | ICD-10-CM | POA: Insufficient documentation

## 2015-02-05 DIAGNOSIS — E119 Type 2 diabetes mellitus without complications: Secondary | ICD-10-CM | POA: Diagnosis not present

## 2015-02-05 HISTORY — DX: Type 2 diabetes mellitus without complications: E11.9

## 2015-02-05 LAB — TROPONIN I: Troponin I: <0.03 ng/mL (ref <0.031)

## 2015-02-05 LAB — URINALYSIS COMPLETE WITH MICROSCOPIC (ARMC ONLY)
BACTERIA UA: NONE SEEN
Bilirubin Urine: NEGATIVE
Glucose, UA: NEGATIVE mg/dL
Hgb urine dipstick: NEGATIVE
NITRITE: NEGATIVE
Protein, ur: NEGATIVE mg/dL
SPECIFIC GRAVITY, URINE: 1.02 (ref 1.005–1.030)
pH: 6 (ref 5.0–8.0)

## 2015-02-05 LAB — CBC
HEMATOCRIT: 42.1 % (ref 35.0–47.0)
HEMOGLOBIN: 13.3 g/dL (ref 12.0–16.0)
MCH: 26.3 pg (ref 26.0–34.0)
MCHC: 31.5 g/dL — ABNORMAL LOW (ref 32.0–36.0)
MCV: 83.5 fL (ref 80.0–100.0)
Platelets: 202 10*3/uL (ref 150–440)
RBC: 5.04 MIL/uL (ref 3.80–5.20)
RDW: 16 % — AB (ref 11.5–14.5)
WBC: 5.7 10*3/uL (ref 3.6–11.0)

## 2015-02-05 LAB — COMPREHENSIVE METABOLIC PANEL
ALK PHOS: 47 U/L (ref 38–126)
ALT: 11 U/L — AB (ref 14–54)
AST: 21 U/L (ref 15–41)
Albumin: 3.6 g/dL (ref 3.5–5.0)
Anion gap: 8 (ref 5–15)
BUN: 32 mg/dL — AB (ref 6–20)
CALCIUM: 9.6 mg/dL (ref 8.9–10.3)
CO2: 29 mmol/L (ref 22–32)
CREATININE: 1.18 mg/dL — AB (ref 0.44–1.00)
Chloride: 105 mmol/L (ref 101–111)
GFR calc non Af Amer: 39 mL/min — ABNORMAL LOW (ref >60)
GFR, EST AFRICAN AMERICAN: 45 mL/min — AB (ref >60)
Glucose, Bld: 150 mg/dL — ABNORMAL HIGH (ref 65–99)
Potassium: 3.8 mmol/L (ref 3.5–5.1)
SODIUM: 142 mmol/L (ref 135–145)
Total Bilirubin: 0.2 mg/dL — ABNORMAL LOW (ref 0.3–1.2)
Total Protein: 7.4 g/dL (ref 6.5–8.1)

## 2015-02-05 LAB — LACTIC ACID, PLASMA: LACTIC ACID, VENOUS: 1.8 mmol/L (ref 0.5–2.0)

## 2015-02-05 NOTE — ED Provider Notes (Signed)
[facility] Emergency Department Provider Note   ____________________________________________  Time seen: 3:00 PM I have reviewed the triage vital signs and the triage nursing note.  HISTORY  Chief Complaint Fatigue   Historian Limited, patient is unresponsive verbally and cannot provide any history. Report received from EMS.  HPI Carrie Juarez is a 81 y.o. female who reportedly lives at home and EMS was called out for patient being unresponsive or decreased responsiveness. She reportedly was moving her extremities but not speaking or opening her eyes. No additional history available at time of patient's arrival.   Additional history obtained from the son named Sammy by telephone. Apparently he lives with his mother. She is normally ambulatory on her own and has had a good appetite and normally talks. She woke up normally this morning and he, the son, laid down around noon. Several hours later he went to check on her They were having company to visit and she was sitting in a chair and they could not get her to wake up her open her eyes or respond to them and so EMS was called.    Past Medical History  Diagnosis Date  . Diabetes mellitus without complication     There are no active problems to display for this patient.   History reviewed. No pertinent past surgical history.  No current outpatient prescriptions on file.  Allergies Review of patient's allergies indicates no known allergies.  No family history on file.  Social History Social History  Substance Use Topics  . Smoking status: Never Smoker   . Smokeless tobacco: None  . Alcohol Use: No   lives at home  Review of Systems Unavailable due to patient's unresponsive/altered mental status. Some review of systems obtained from son over the phone Constitutional: No recent illnesses or fever Eyes:  ENT: Cardiovascular: Respiratory: . No cough Gastrointestinal: . Normal appetite. No  vomiting or diarrhea. Genitourinary:  Musculoskeletal: . Skin:  Neurological:   ____________________________________________   PHYSICAL EXAM:  VITAL SIGNS: ED Triage Vitals  Enc Vitals Group     BP --      Pulse --      Resp --      Temp --      Temp src --      SpO2 --      Weight --      Height --      Head Cir --      Peak Flow --      Pain Score --      Pain Loc --      Pain Edu? --      Excl. in GC? --      Constitutional: Not opening eyes or making any verbal, but no acute distress Eyes: She clenches her eyes tight upon trying to open her eyes. Conjunctivae are normal. PERRL.  ENT   Head: Normocephalic and atraumatic.   Nose: No congestion/rhinnorhea.   Mouth/Throat: Mucous membranes are moist.   Neck: No stridor. Supple Cardiovascular/Chest: Normal rate, regular rhythm.  No murmurs, rubs, or gallops. Respiratory: Normal respiratory effort without tachypnea nor retractions. Breath sounds are clear and equal bilaterally. No wheezes/rales/rhonchi. Gastrointestinal: Soft. No distention, no guarding, no rebound. Nontender   Genitourinary/rectal:Deferred Musculoskeletal: No joint effusions.   No edema. No deformities Neurologic:  No verbal. Attempts to follow commands when asked to squeeze my hand. Does not open her eyes to command. Appears generally weak and 4 extremity is, but she does move 4 extremities  spontaneously. Skin:  Skin is warm, dry and intact. No rash noted.  ____________________________________________   EKG I, Governor Rooks, MD, the attending physician have personally viewed and interpreted all ECGs.  61 bpm. Sinus rhythm with first-degree AV block. Narrow QRS. Normal axis. T-wave inverted in V5 and V6 ____________________________________________  LABS (pertinent positives/negatives)  Urinalysis trace ketones, trace leukocytes, no bacteria, white blood cells or red blood cells Lactate 1.8 White blood count is 5.7, hemoglobin 13.3  and platelets 202 Complete medical panel without significant abnormality other than BUN 32 and creatinine 1.18 Troponin less than 0.03 ____________________________________________  RADIOLOGY All Xrays were viewed by me. Imaging interpreted by Radiologist.  CT head noncontrast: No evidence of acute intercranial abnormality. Atrophy with small vessel ischemic changes __________________________________________  PROCEDURES  Procedure(s) performed: None  Critical Care performed: None  ____________________________________________   ED COURSE / ASSESSMENT AND PLAN  CONSULTATIONS: None  Pertinent labs & imaging results that were available during my care of the patient were reviewed by me and considered in my medical decision making (see chart for details).   Unusual presentation for altered mental status with the patient who is moving all 4 extremities and is holding her eyes closed when I attempt to open them. No seizure activity noted. No history of seizures.  Stable vital signs. Head CT negative for acute cause of altered mental status. Laboratory evaluation reassuring. Patient did ultimately wake up and returned to baseline.  Multiple family members including her daughter and grandchildren are at bedside, and states she is back to her normal self. They state that she has had an episode sort of like this in the past, although at that point time when they called it a TIA, she was awake and might of had facial droop.  Although it might be a possibility this was a TIA, I feel it is very unusual presentation and neurologic symptoms, that I think this is extremely low likelihood. Although not exactly sure what this episode was, her exam and evaluation are reassuring and she's back to baseline. I talked with the family about the possibility of discharging her home to follow up with her primary care doctor tomorrow which is Dr. Marnette Burgess, and they are supportive of this decision. We also discussed  the possibility of admission/observation for 24 hours overnight, however the did not want to pursue that option.  Patient / Family / Caregiver informed of clinical course, medical decision-making process, and agree with plan.   I discussed return precautions, follow-up instructions, and discharged instructions with patient and/or family.  ___________________________________________   FINAL CLINICAL IMPRESSION(S) / ED DIAGNOSES   Final diagnoses:  Altered mental status, unspecified altered mental status type       Governor Rooks, MD 02/05/15 [phone]

## 2015-02-05 NOTE — ED Notes (Signed)
Pt comes into the ED via EMS c/o unresponsiveness.  The patient responds to pain and stimuli.  Patient normally, according to the family, is A&Ox4 and comes from home.  Patient had unremarkable EKG, CBG 162, 96% room air, 144/94, and 68HR.  History of DM.

## 2015-02-05 NOTE — ED Notes (Signed)
MD at bedside. 

## 2015-02-05 NOTE — Discharge Instructions (Signed)
You were evaluated for an episode of altered mental status where you weren't opening your eyes, and also no certain cause was found, your exam and evaluation are reassuring, and you are better now. We discussed the possibility of staying overnight for observation and monitoring, versus going home with your family and following up with a primary care physician tomorrow. We decided that going home was reasonable and if you have any new or worsening condition including chest or abdominal pain, altered mental status, weakness, numbness, fever, or any other symptoms concerning to you, return to the emergency department immediately.

## 2015-03-04 ENCOUNTER — Emergency Department
Admission: EM | Admit: 2015-03-04 | Discharge: 2015-03-11 | Disposition: E | Payer: Medicare Other | Attending: Emergency Medicine | Admitting: Emergency Medicine

## 2015-03-04 DIAGNOSIS — E119 Type 2 diabetes mellitus without complications: Secondary | ICD-10-CM | POA: Insufficient documentation

## 2015-03-04 DIAGNOSIS — I469 Cardiac arrest, cause unspecified: Secondary | ICD-10-CM | POA: Insufficient documentation

## 2015-03-04 DIAGNOSIS — J9600 Acute respiratory failure, unspecified whether with hypoxia or hypercapnia: Secondary | ICD-10-CM | POA: Insufficient documentation

## 2015-03-04 MED ORDER — EPINEPHRINE HCL 0.1 MG/ML IJ SOSY
PREFILLED_SYRINGE | INTRAMUSCULAR | Status: AC
  Filled 2015-03-04: qty 50

## 2015-03-04 MED ORDER — DEXTROSE 5 % IV SOLN: 60.0000 mg/h | Freq: Once | INTRAVENOUS | Status: DC

## 2015-03-04 MED ORDER — AMIODARONE HCL IN DEXTROSE 360-4.14 MG/200ML-% IV SOLN
60.0000 mg/h | Freq: Once | INTRAVENOUS | Status: DC
  Filled 2015-03-04: qty 200

## 2015-03-04 NOTE — ED Provider Notes (Signed)
[facility] Emergency Department Provider Note  ____________________________________________  Time seen: Approximately 10:57 PM  I have reviewed the triage vital signs and the nursing notes.   HISTORY  Chief Complaint Unresponsive  The patient is unresponsive  HPI Carrie Juarez is a 81 y.o. female with history of diabetes and who recently has been seen several times in the emergency department and hospitalized at least once who presents as an emergency traffic by EMS after passing out in front of her family.  Reportedly she passed out and went unresponsive but then was starting to come around by the time EMS got there.  She was conversant with them but after she got loaded into the ambulance she once again went completely unresponsive.  She started having bradycardia and they attempted transcutaneous pacing for which they state they got both mechanical and electrical capture.  She was having agonal respirations and they were trying to bag her.    When she arrived she was cooland was not breathing.  In spite of the transcutaneous pacing I could not feel a pulse so we stopped the pacing and started CPR at 22:16.  Please refer to the Hospital course for more details.   Past Medical History  Diagnosis Date  . Diabetes mellitus without complication     There are no active problems to display for this patient.   No past surgical history on file.  No current outpatient prescriptions on file.  Allergies Review of patient's allergies indicates no known allergies.  No family history on file.  Social History Social History  Substance Use Topics  . Smoking status: Never Smoker   . Smokeless tobacco: Not on file  . Alcohol Use: No    Review of Systems Unable to obtain secondary to unresponsiveness  ____________________________________________   PHYSICAL EXAM:  ED Triage Vitals  Enc Vitals Group     BP --      Pulse --      Resp --      Temp --       Temp src --      SpO2 --      Weight --      Height --      Head Cir --      Peak Flow --      Pain Score --      Pain Loc --      Pain Edu? --      Excl. in GC? --      Constitutional: Unresponsive, GCS 3, cool and clammy skin Eyes: Pupils fixed and dilated Head: Atraumatic. Nose: No epistaxis. Mouth/Throat: Mucous membranes are dry.  No dentures Neck: No stridor.  No subcutaneous emphysema Cardiovascular: Full cardiopulmonary arrest upon arrival with no palpable pulse Respiratory: No respiratory effort, bags easily with BVM, moderate respiratory secretions are present in the airway Gastrointestinal: Mild distention, no pulsatile masses  Musculoskeletal: No obvious deformities of her extremities and no joint effusions Neurologic:  GCS 3.  Skin:  Skin is cool, dry and intact. No rash noted.   ____________________________________________   LABS (all labs ordered are listed, but only abnormal results are displayed)  Labs Reviewed - No data to display ____________________________________________  EKG  ED ECG REPORT I, Sheleen Conchas, the attending physician, personally viewed and interpreted this ECG.   Date: 02/27/2015  EKG Time: 22:44  Rate: 41  Rhythm: Likely junctional rhythm  Axis: Right axis deviation  Intervals:Widened QRS, bifascicular block, no organized P waves  ST&T  Change: ST segment elevation in leads 23 and aVF.  Nonspecific changes in other leads.  Please note that this EKG was obtained after 30 minutes of CPR, one defibrillation, and transcutaneous pacing by EMS prior to arrival.  ____________________________________________  RADIOLOGY   No results found.  ____________________________________________   PROCEDURES  Procedure(s) performed: CPR, intubation, see procedure note(s).  Critical Care performed: Yes, see critical care note(s)   CRITICAL CARE Performed by: Loleta Rose   Total critical care time: 60 minutes  Critical care  time was exclusive of separately billable procedures and treating other patients.  Critical care was necessary to treat or prevent imminent or life-threatening deterioration.  Critical care was time spent personally by me on the following activities: development of treatment plan with patient and/or surrogate as well as nursing, discussions with consultants, evaluation of patient's response to treatment, examination of patient, obtaining history from patient or surrogate, ordering and performing treatments and interventions, ordering and review of laboratory studies, ordering and review of radiographic studies, pulse oximetry and re-evaluation of patient's condition.  INTUBATION Performed by: Loleta Rose  Required items: required blood products, implants, devices, and special equipment available Patient identity confirmed: provided demographic data and hospital-assigned identification number Time out: Immediately prior to procedure a "time out" was called to verify the correct patient, procedure, equipment, support staff and site/side marked as required.  Indications: unresponsive, cardiopulmonary arrest  Intubation method: Glidescope Laryngoscopy   Preoxygenation: BVM  Sedatives/Paralytic: none Tube Size: 7.5 cuffed (21 at teeth).  I visualized the tube passing through the cords with the glidescope.    Post-procedure assessment: chest rise and ETCO2 monitor Breath sounds: equal and absent over the epigastrium Tube secured with: ETT holder  Patient is unresponsive.   ____________________________________________   INITIAL IMPRESSION / ASSESSMENT AND PLAN / ED COURSE  Pertinent labs & imaging results that were available during my care of the patient were reviewed by me and considered in my medical decision making (see chart for details).  We proceeded with CPR per ACLS algorithms for a total of approximately 40 minutes.  Please refer to the nursing documentation for precise  timing details, but in short, the patient received approximately 5 or 6 rounds of epinephrine 1 mg IV, vasopressin 40 units IV, 2 g of magnesium IV, 1 g of calcium chloride IV, atropine 1 mg, amiodarone 300 mg IV followed by an amiodarone drip at 1 mg/m, she was started on dopamine and titrated up quickly to 10 mcg/kg/min in an attempt to improve her cardiac output after she briefly got back a perfusing rhythm.  She also got 2 Amps of sodium bicarbonate.  After several rounds of CPR and rhythm checks, she went from a PEA arrest to a ventricular fibrillation.  We performed defibrillation with 200 J and immediately continue with 2 more minutes of CPR.  At the next rhythm check she had an organized rhythm with multiple PVCs.  Given the arrhythmia we tried amiodarone.  At the point the patient had a palpable pulse I discussed the situation with the patient's daughter.  The patient had no living will or DO NOT RESUSCITATE/DO NOT INTUBATE order.  We brought the daughter to the room as we started coding her again after she became more bradycardic and lost a pulse.  After approximately 40 minutes of CPR total she has not had several rhythm checks that indicated PEA versus asystole (the electrolyte activity, when present, was very, very rare and disorganized).  At that point the daughter and  I decided that we should call the code.  Time of death was at 22:54.  The patient's daughter is not interested in the patient going to the medical examiner and I agree that there is little to be gained by sending her to the examiner.  ____________________________________________  FINAL CLINICAL IMPRESSION(S) / ED DIAGNOSES  Final diagnoses:  Cardiopulmonary arrest  Acute respiratory failure, unspecified whether with hypoxia or hypercapnia      NEW MEDICATIONS STARTED DURING THIS VISIT:  New Prescriptions   No medications on file     Loleta Rose, MD April 02, 2015 [phone]

## 2015-03-04 NOTE — Progress Notes (Signed)
Pt arrived via EMS bagged by EMS. Continued bagging pt with 100% O2. Pt intubated by Dr. York Cerise on first attempt with 7.5 ETT secured at the 21cm mark with a commercial ETT holder. Pt tolerated well. Positive color change on Co2 detector, chest rise noted, bilateral, equal breath sounds noted. Pt bagged during multiple CPR rounds with Zoll Co2 adaptor in place. Suctioned small amounts of frothy, bright red secretions via ETT.

## 2015-03-05 LAB — GLUCOSE, CAPILLARY: GLUCOSE-CAPILLARY: 160 mg/dL — AB (ref 65–99)

## 2015-03-11 NOTE — Progress Notes (Signed)
   03-11-2015 0000  Clinical Encounter Type  Visited With Patient and family together  Visit Type Death  Spiritual Encounters  Spiritual Needs Sacred text;Prayer;Ritual;Emotional;Grief support  Stress Factors  Patient Stress Factors Loss  Family Stress Factors Loss;Family relationships;Major life changes  Met w/family and provided grief support & prayer.  [place]. [place]

## 2015-03-11 NOTE — ED Notes (Signed)
2 rings given to patient's daughter, Biagio Quint.  Rings - 1 silver colored band, 1 silver colored band with clear colored stone.  Daughters contact information Biagio Quint  [phone]; Dellia Nims [phone]; Marissa Calamity [phone].

## 2015-03-11 NOTE — ED Notes (Signed)
Family at bedside. Chaplain present  °

## 2015-03-11 DEATH — deceased

## 2016-08-10 IMAGING — CT CT HEAD W/O CM
1 series · 16 of 28 positions shown, 20 images · non-contrast
Comparison: 05/21/2013

CLINICAL DATA: Altered mental status/unresponsive

EXAM:
CT HEAD WITHOUT CONTRAST
TECHNIQUE: Contiguous axial images were obtained from the base of the skull
through the vertex without intravenous contrast.

[Series 2: head wo · axial · 0.43mm/px · z∈[-129,-16]mm · 16 of 28 slices shown, 20 images]
[im 2/28  brain]
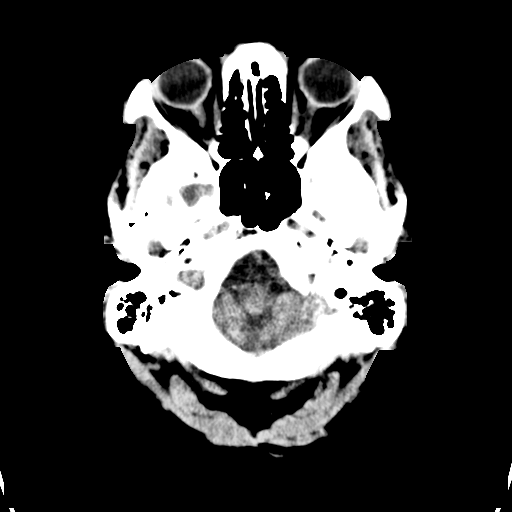
[im 2/28  bone]
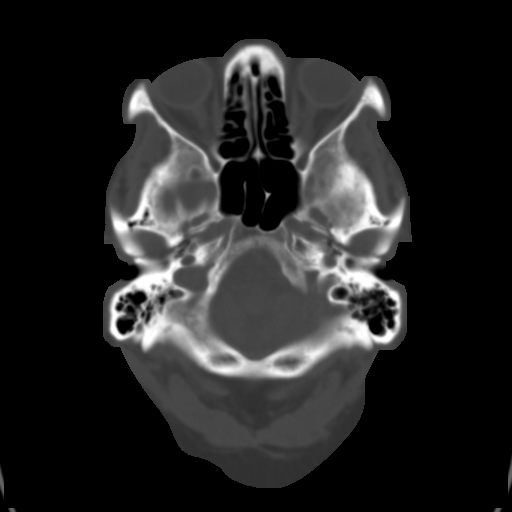
[im 4/28  brain]
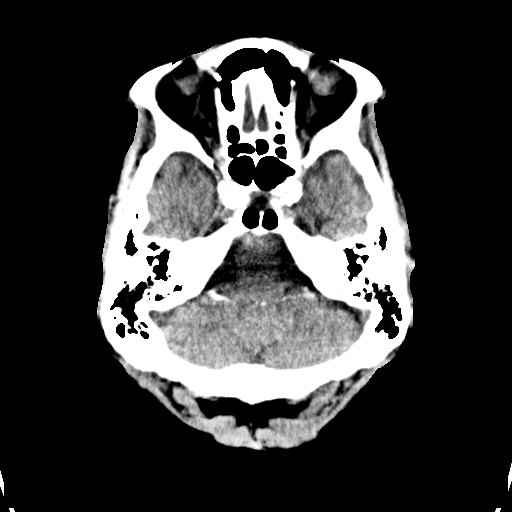
[im 6/28  brain]
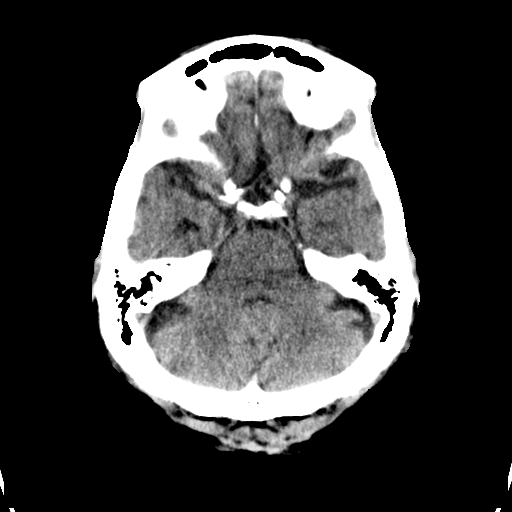
[im 7/28  brain]
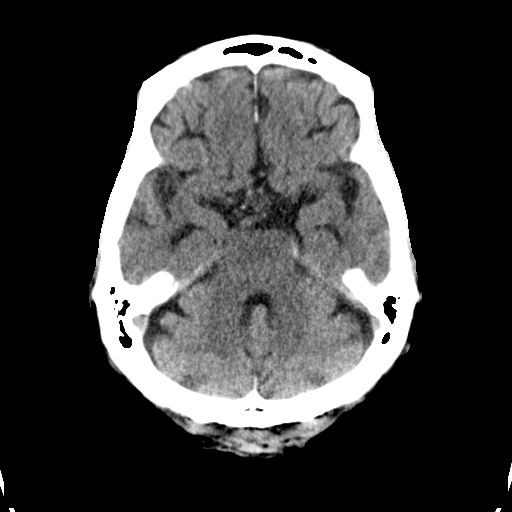
[im 9/28  brain]
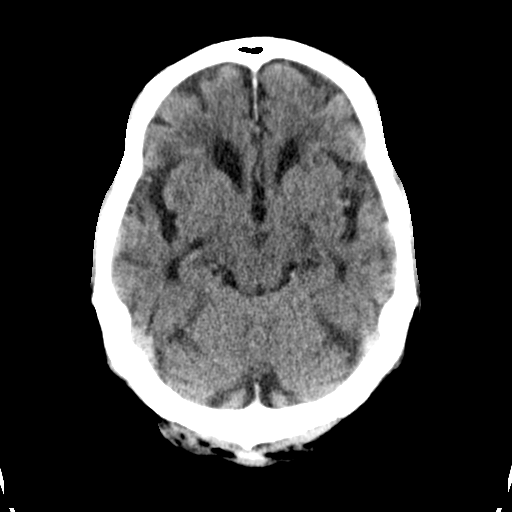
[im 9/28  bone]
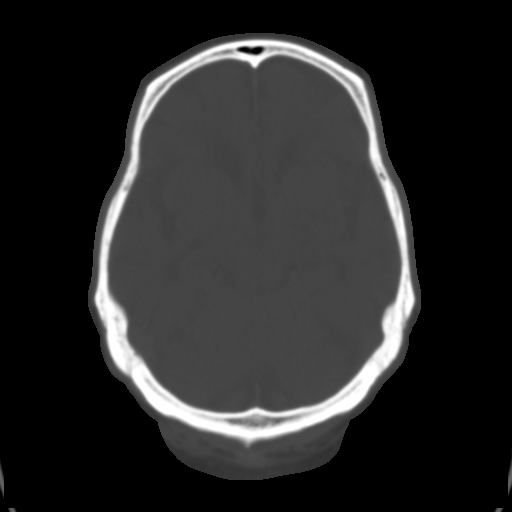
[im 10/28  brain]
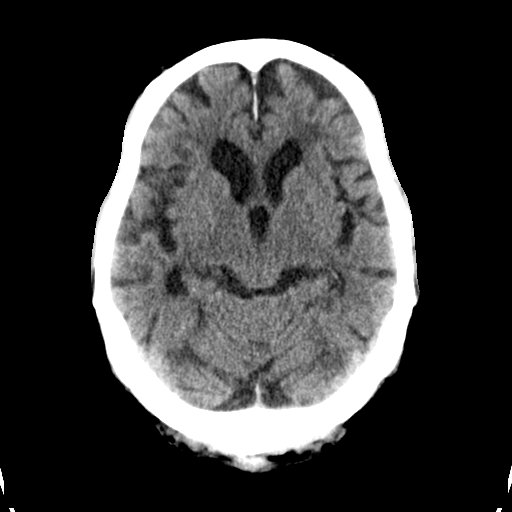
[im 12/28  brain]
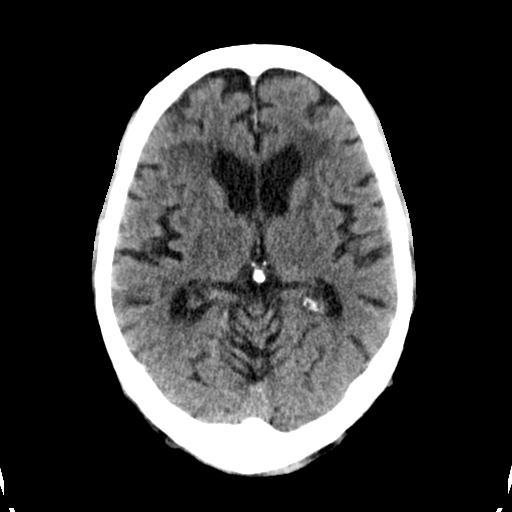
[im 14/28  brain]
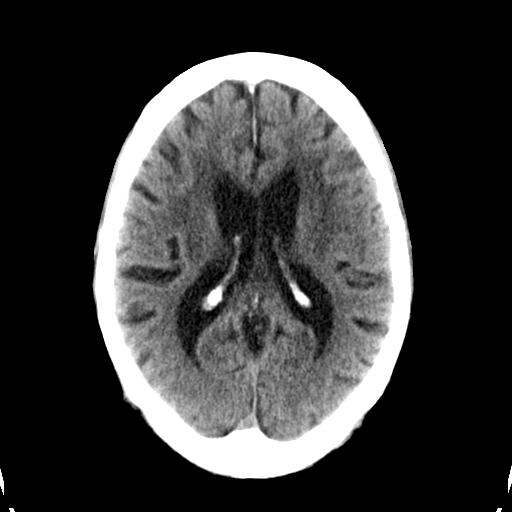
[im 15/28  brain]
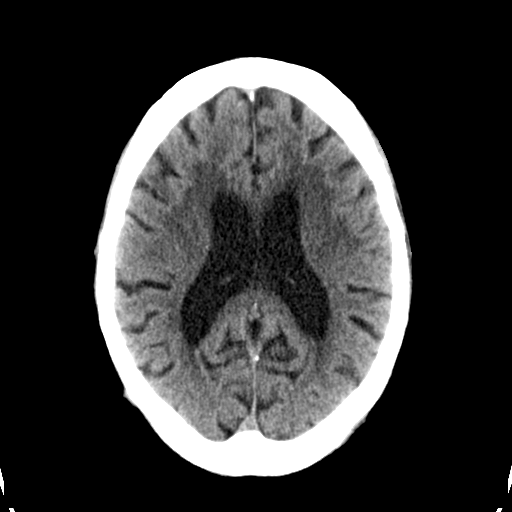
[im 15/28  bone]
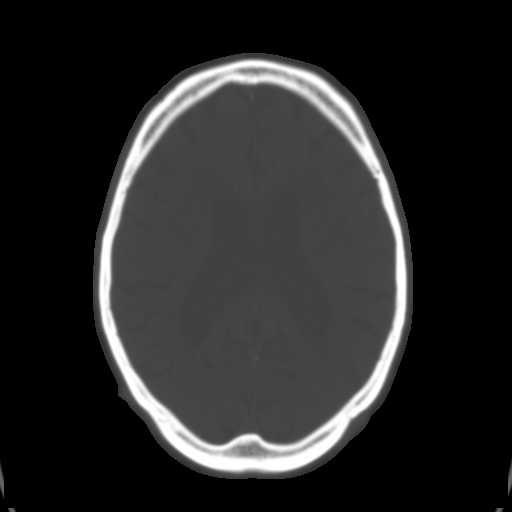
[im 17/28  brain]
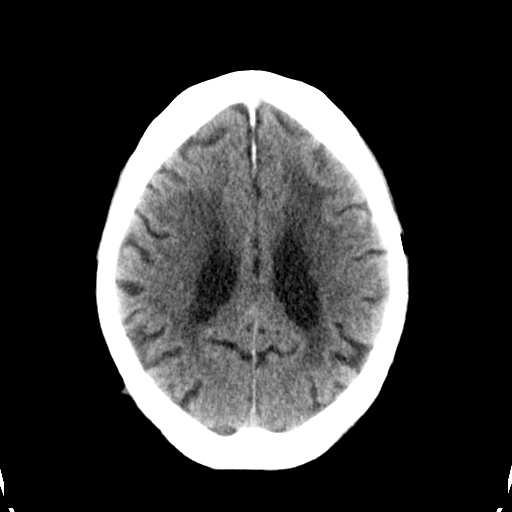
[im 19/28  brain]
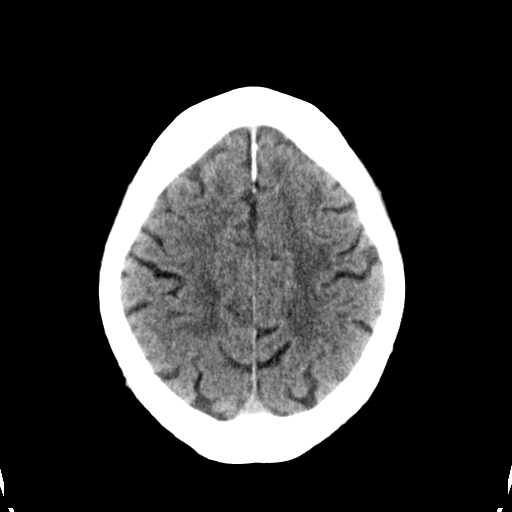
[im 20/28  brain]
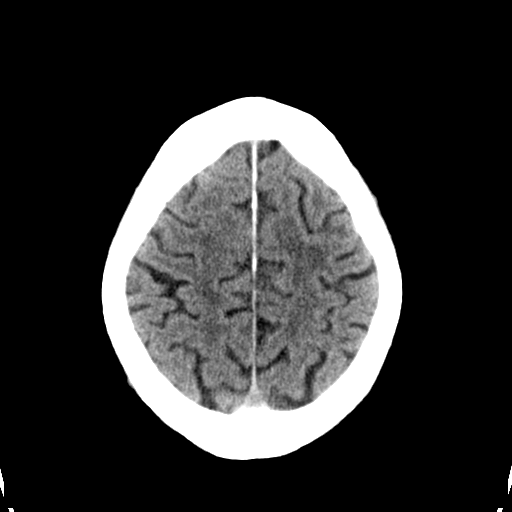
[im 22/28  brain]
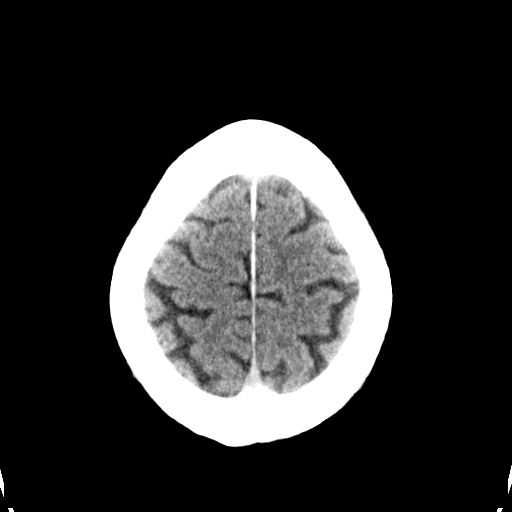
[im 22/28  bone]
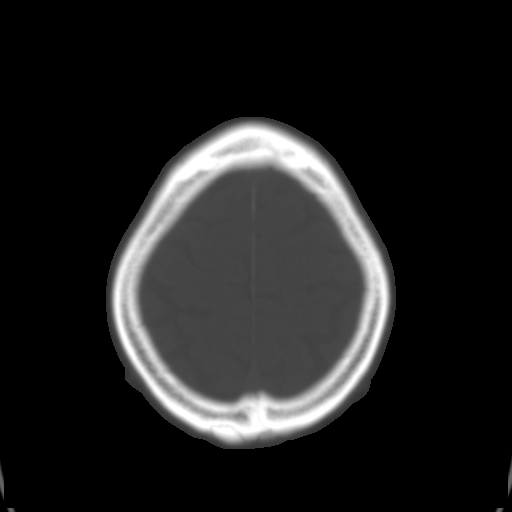
[im 23/28  brain]
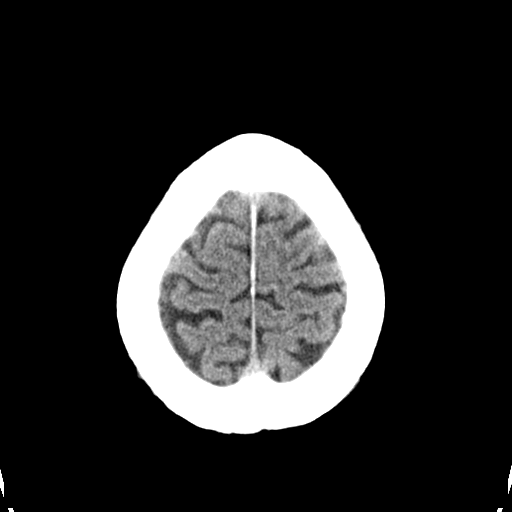
[im 25/28  brain]
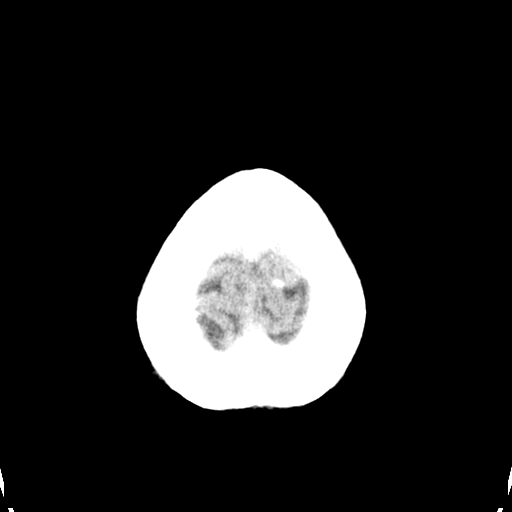
[im 27/28  brain]
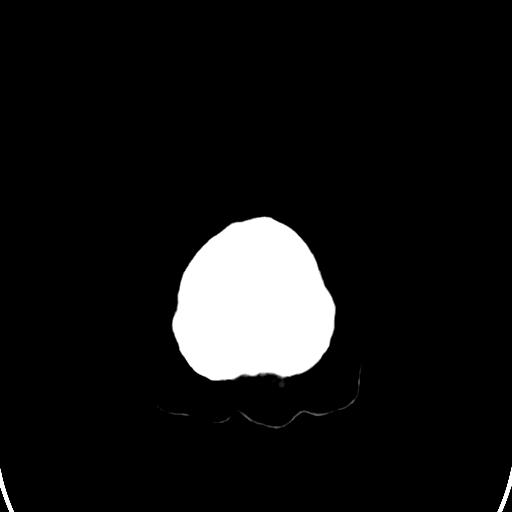

[16 of 28 positions shown; findings below may reference images not displayed]

FINDINGS: No evidence of parenchymal hemorrhage or extra-axial fluid
collection. No mass lesion, mass effect, or midline shift.

No CT evidence of acute infarction.

Subcortical white matter and periventricular small vessel ischemic
changes. Intracranial atherosclerosis.

Global cortical and central atrophy. Secondary mild ventricular
prominence.

The visualized paranasal sinuses are essentially clear. The mastoid
air cells are unopacified.

No evidence of calvarial fracture.
IMPRESSION: No evidence of acute intracranial abnormality.

Atrophy with small vessel ischemic changes.
